# Patient Record
Sex: Female | Born: 1970 | Race: Black or African American | Hispanic: No | Marital: Single | Smoking: Current every day smoker
Health system: Southern US, Community
[De-identification: ages and names within clinical notes are randomized; demographics above are authoritative.]

---

## 2014-10-19 ENCOUNTER — Encounter (HOSPITAL_COMMUNITY): Payer: Self-pay | Admitting: Emergency Medicine

## 2014-10-19 ENCOUNTER — Emergency Department (INDEPENDENT_AMBULATORY_CARE_PROVIDER_SITE_OTHER)
Admission: EM | Admit: 2014-10-19 | Discharge: 2014-10-19 | Disposition: A | Discharge status: Home / Self Care | Payer: Self-pay | Source: Home / Self Care | Attending: Family Medicine | Admitting: Family Medicine

## 2014-10-19 DIAGNOSIS — L250 Unspecified contact dermatitis due to cosmetics: Secondary | ICD-10-CM

## 2014-10-19 MED ORDER — TRIAMCINOLONE ACETONIDE 40 MG/ML IJ SUSP
40.0000 mg | Freq: Once | INTRAMUSCULAR | Status: AC
  Administered 2014-10-19: 40 mg via INTRAMUSCULAR

## 2014-10-19 MED ORDER — PREDNISONE 20 MG PO TABS: ORAL_TABLET | ORAL | Status: AC

## 2014-10-19 MED ORDER — TRIAMCINOLONE ACETONIDE 0.1 % EX CREA: 1.0000 "application " | TOPICAL_CREAM | Freq: Two times a day (BID) | CUTANEOUS | Status: AC

## 2014-10-19 MED ORDER — TRIAMCINOLONE ACETONIDE 40 MG/ML IJ SUSP
INTRAMUSCULAR | Status: AC
  Filled 2014-10-19: qty 1

## 2014-10-19 NOTE — ED Provider Notes (Signed)
CSN: [id number]     Arrival date & time 10/19/14  1317 History   First MD Initiated Contact with Patient 10/19/14 1341     Chief Complaint  Patient presents with  . Allergic Reaction   (Consider location/radiation/quality/duration/timing/severity/associated sxs/prior Treatment) HPI Comments: 44 year old female applied a black coloring agent to her hair. Within hours she developed itching and a rash to the scalp, ears and face. Primary affect is to the posterior portion of the scalp and neck. Some puffiness to the face and around the eyes. Denies systemic symptoms. No shortness of breath, cough or peripheral swelling.  Patient is a 44 y.o. female presenting with allergic reaction.  Allergic Reaction Presenting symptoms: rash     History reviewed. No pertinent past medical history. History reviewed. No pertinent past surgical history. No family history on file. Social History  Substance Use Topics  . Smoking status: Current Every Day Smoker -- 0.50 packs/day    Types: Cigarettes  . Smokeless tobacco: None  . Alcohol Use: Yes   OB History    No data available     Review of Systems  Constitutional: Negative for fever, activity change and fatigue.  HENT: Negative.   Eyes: Negative.   Respiratory: Negative.   Cardiovascular: Negative.   Genitourinary: Negative.   Skin: Positive for rash.  Neurological: Negative.   Psychiatric/Behavioral: Negative.     Allergies  Review of patient's allergies indicates no known allergies.  Home Medications   Prior to Admission medications   Medication Sig Start Date End Date Taking? Authorizing Provider  predniSONE (DELTASONE) 20 MG tablet Take 3 tabs po on first day, 2 tabs second day, 2 tabs third day, 1 tab fourth day, 1 tab 5th day. Take with food.Start 10/20/14. 10/19/14   Hayden Rasmussen, NP  triamcinolone cream (KENALOG) 0.1 % Apply 1 application topically 2 (two) times daily. 10/19/14   Hayden Rasmussen, NP   Meds Ordered and Administered this  Visit   Medications  triamcinolone acetonide (KENALOG-40) injection 40 mg (not administered)    BP 142/94 mmHg  Pulse 94  Temp(Src) 98.4 F (36.9 C) (Oral)  Resp 16  SpO2 99%  LMP 10/19/2014 No data found.   Physical Exam  Constitutional: She is oriented to person, place, and time. She appears well-developed and well-nourished. No distress.  HENT:  No intraoral swelling, edema, erythema, hoarseness.  Eyes: EOM are normal.  Neck: Normal range of motion. Neck supple.  Cardiovascular: Normal rate.   Pulmonary/Chest: Effort normal and breath sounds normal. No respiratory distress.  Musculoskeletal: She exhibits no edema.  Neurological: She is alert and oriented to person, place, and time. She exhibits normal muscle tone.  Skin: Skin is warm and dry. Rash noted.  Pruritic papular in scaling rash to the posterior scalp and neck. There is erythema, swelling and moist rash to the outer ears. Mild puffiness around both eyes. The eye proper is unaffected. Patient is unaffected.  Psychiatric: She has a normal mood and affect.  Nursing note and vitals reviewed.   ED Course  Procedures (including critical care time)  Labs Review Labs Reviewed - No data to display  Imaging Review No results found.   Visual Acuity Review  Right Eye Distance:   Left Eye Distance:   Bilateral Distance:    Right Eye Near:   Left Eye Near:    Bilateral Near:         MDM   1. Contact dermatitis due to cosmetics    Kenalog 40 mg IM  Prednisone taper dose Triamcinolone topicl cream. Advised to use as lotion to scalp with addition of small amt of water. Cool compreses    Hayden Rasmussen, NP 10/19/14 1355

## 2014-10-19 NOTE — Discharge Instructions (Signed)

## 2014-10-19 NOTE — ED Notes (Signed)
C/o allergic reaction to hair dye onset 2 days ago Sx include: rash on scalp, facial swelling and swelling of bilateral ears Denies fevers, chills, SOB, dyspnea Alert and oriented x4... No acute distress.

## 2014-10-20 ENCOUNTER — Emergency Department (HOSPITAL_COMMUNITY): Payer: Self-pay

## 2014-10-20 ENCOUNTER — Emergency Department (HOSPITAL_COMMUNITY)
Admission: EM | Admit: 2014-10-20 | Discharge: 2014-10-20 | Disposition: A | Discharge status: Home / Self Care | Payer: Self-pay | Attending: Emergency Medicine | Admitting: Emergency Medicine

## 2014-10-20 ENCOUNTER — Encounter (HOSPITAL_COMMUNITY): Payer: Self-pay

## 2014-10-20 DIAGNOSIS — L234 Allergic contact dermatitis due to dyes: Secondary | ICD-10-CM | POA: Insufficient documentation

## 2014-10-20 DIAGNOSIS — Z7952 Long term (current) use of systemic steroids: Secondary | ICD-10-CM | POA: Insufficient documentation

## 2014-10-20 DIAGNOSIS — Z720 Tobacco use: Secondary | ICD-10-CM | POA: Insufficient documentation

## 2014-10-20 DIAGNOSIS — H0520 Unspecified exophthalmos: Secondary | ICD-10-CM | POA: Insufficient documentation

## 2014-10-20 DIAGNOSIS — T7840XD Allergy, unspecified, subsequent encounter: Secondary | ICD-10-CM

## 2014-10-20 LAB — CBC WITH DIFFERENTIAL/PLATELET
BASOS ABS: 0 10*3/uL (ref 0.0–0.1)
Basophils Relative: 0 %
EOS ABS: 0.4 10*3/uL (ref 0.0–0.7)
Eosinophils Relative: 5 %
HCT: 34.1 % — ABNORMAL LOW (ref 36.0–46.0)
HEMOGLOBIN: 10.4 g/dL — AB (ref 12.0–15.0)
LYMPHS PCT: 13 %
Lymphs Abs: 1 10*3/uL (ref 0.7–4.0)
MCH: 21.8 pg — ABNORMAL LOW (ref 26.0–34.0)
MCHC: 30.5 g/dL (ref 30.0–36.0)
MCV: 71.3 fL — ABNORMAL LOW (ref 78.0–100.0)
Monocytes Absolute: 0.3 10*3/uL (ref 0.1–1.0)
Monocytes Relative: 4 %
NEUTROS ABS: 5.8 10*3/uL (ref 1.7–7.7)
NEUTROS PCT: 78 %
Platelets: 261 10*3/uL (ref 150–400)
RBC: 4.78 MIL/uL (ref 3.87–5.11)
RDW: 17.6 % — ABNORMAL HIGH (ref 11.5–15.5)
WBC: 7.5 10*3/uL (ref 4.0–10.5)

## 2014-10-20 LAB — COMPREHENSIVE METABOLIC PANEL
ALBUMIN: 4.2 g/dL (ref 3.5–5.0)
ALK PHOS: 68 U/L (ref 38–126)
ALT: 16 U/L (ref 14–54)
ANION GAP: 10 (ref 5–15)
AST: 28 U/L (ref 15–41)
BUN: 6 mg/dL (ref 6–20)
CALCIUM: 8.6 mg/dL — AB (ref 8.9–10.3)
CHLORIDE: 106 mmol/L (ref 101–111)
CO2: 26 mmol/L (ref 22–32)
Creatinine, Ser: 0.66 mg/dL (ref 0.44–1.00)
GFR calc Af Amer: >60 mL/min (ref >60)
GFR calc non Af Amer: >60 mL/min (ref >60)
GLUCOSE: 113 mg/dL — AB (ref 65–99)
Potassium: 3.6 mmol/L (ref 3.5–5.1)
SODIUM: 142 mmol/L (ref 135–145)
Total Bilirubin: 0.5 mg/dL (ref 0.3–1.2)
Total Protein: 7.6 g/dL (ref 6.5–8.1)

## 2014-10-20 MED ORDER — DIPHENHYDRAMINE HCL 25 MG PO TABS
50.0000 mg | ORAL_TABLET | ORAL | Status: AC | PRN
Start: 2014-10-20 — End: ?

## 2014-10-20 MED ORDER — IOHEXOL 300 MG/ML  SOLN
75.0000 mL | Freq: Once | INTRAMUSCULAR | Status: AC | PRN
  Administered 2014-10-20: 75 mL via INTRAVENOUS

## 2014-10-20 NOTE — ED Provider Notes (Signed)
CSN: [id number]     Arrival date & time 10/20/14  0127 History  By signing my name below, I, Elon Spanner, attest that this documentation has been prepared under the direction and in the presence of Loren Racer, MD. Electronically Signed: Elon Spanner, ED Scribe. 10/20/2014. 2:44 AM.    Chief Complaint  Patient presents with  . Alcohol Intoxication  . Blurred Vision   The history is provided by the patient. No language interpreter was used.   HPI Comments: Denise Burke is a 44 y.o. female who presents to the Emergency Department complaining of worsening left eye blurred vision onset today after coloring her hair.  Associated symptoms include tearing and redness of the left eye as well as facial swelling. The patient was seen today at Urgent Care after coloring her hair and developing a rash to the scalp ears and face as well as puffiness to the face and around the eyes.  She was dx'd with contact dermatitis and given Kenalog IM, a prednisone taper, and triamcinolone cream.  Patient denies hx of thyroid conditions.    Patient reports drinking alcohol at home prior to arrival.   History reviewed. No pertinent past medical history. History reviewed. No pertinent past surgical history. History reviewed. No pertinent family history. Social History  Substance Use Topics  . Smoking status: Current Every Day Smoker -- 0.50 packs/day    Types: Cigarettes  . Smokeless tobacco: None  . Alcohol Use: Yes   OB History    No data available     Review of Systems  Constitutional: Positive for fever. Negative for chills.  HENT: Positive for facial swelling. Negative for trouble swallowing and voice change.   Eyes: Positive for pain, discharge, redness and visual disturbance.  Respiratory: Negative for shortness of breath.   Cardiovascular: Negative for chest pain and palpitations.  Gastrointestinal: Negative for nausea, vomiting and abdominal pain.  Musculoskeletal: Negative for back pain,  neck pain and neck stiffness.  Skin: Positive for rash.  Neurological: Negative for dizziness, weakness, light-headedness, numbness and headaches.  All other systems reviewed and are negative.     Allergies  Review of patient's allergies indicates no known allergies.  Home Medications   Prior to Admission medications   Medication Sig Start Date End Date Taking? Authorizing Provider  diphenhydrAMINE (BENADRYL) 25 MG tablet Take 2 tablets (50 mg total) by mouth every 4 (four) hours as needed for allergies. 10/20/14   Loren Racer, MD  predniSONE (DELTASONE) 20 MG tablet Take 3 tabs po on first day, 2 tabs second day, 2 tabs third day, 1 tab fourth day, 1 tab 5th day. Take with food.Start 10/20/14. 10/19/14   Hayden Rasmussen, NP  triamcinolone cream (KENALOG) 0.1 % Apply 1 application topically 2 (two) times daily. 10/19/14   Hayden Rasmussen, NP   BP 125/89 mmHg  Pulse 107  Temp(Src) 98.3 F (36.8 C) (Oral)  Resp 19  Ht 5' 1.5" (1.562 m)  Wt 130 lb (58.968 kg)  BMI 24.17 kg/m2  SpO2 99%  LMP 10/19/2014 Physical Exam  Constitutional: She is oriented to person, place, and time. She appears well-developed and well-nourished. No distress.  HENT:  Head: Normocephalic and atraumatic.  Mouth/Throat: Oropharynx is clear and moist.  Mild bilateral periorbital swelling. Patient also with bilateral exophthalmosis. Mild conjunctival injection.  Eyes: EOM are normal. Pupils are equal, round, and reactive to light.  Pupils equal round reactive to light.  Neck: Normal range of motion. Neck supple.  Cardiovascular: Normal rate and regular  rhythm.  Exam reveals no gallop and no friction rub.   No murmur heard. Pulmonary/Chest: Effort normal and breath sounds normal. No stridor. No respiratory distress. She has no wheezes. She has no rales. She exhibits no tenderness.  Abdominal: Soft. Bowel sounds are normal. She exhibits no distension and no mass. There is no tenderness. There is no rebound and no  guarding.  Musculoskeletal: Normal range of motion. She exhibits no edema or tenderness.  Neurological: She is alert and oriented to person, place, and time.  Moves all extremities without deficit. Sensation grossly intact. Ambulating without difficulty. Patient appears to be intoxicated and admits to drinking alcohol.  Skin: Skin is warm and dry. No rash noted. No erythema.  Psychiatric: She has a normal mood and affect. Her behavior is normal.  Nursing note and vitals reviewed.   ED Course  Procedures (including critical care time)  DIAGNOSTIC STUDIES: Oxygen Saturation is 98% on RA, normal by my interpretation.    COORDINATION OF CARE:  2:40 AM Discussed treatment plan with patient at bedside.  Patient acknowledges and agrees with plan.    Labs Review Labs Reviewed  CBC WITH DIFFERENTIAL/PLATELET - Abnormal; Notable for the following:    Hemoglobin 10.4 (*)    HCT 34.1 (*)    MCV 71.3 (*)    MCH 21.8 (*)    RDW 17.6 (*)    All other components within normal limits  COMPREHENSIVE METABOLIC PANEL - Abnormal; Notable for the following:    Glucose, Bld 113 (*)    Calcium 8.6 (*)    All other components within normal limits    Imaging Review Ct Orbits W/cm  10/20/2014   CLINICAL DATA:  Protrusion of eye balls. Puffiness, blurred vision, tearing and redness after hair coloring  EXAM: CT ORBITS WITH CONTRAST  TECHNIQUE: Multidetector CT imaging of the orbits was performed following the bolus administration of intravenous contrast.  CONTRAST:  75mL OMNIPAQUE IOHEXOL 300 MG/ML  SOLN  COMPARISON:  None.  FINDINGS: There is symmetric preseptal prominent thickness and enhancement conjunctivitis with chemosis. No postseptal inflammatory change noted. Both globes are proptotic, likely developmental. Prominent but symmetric and within normal limits lacrimal glands, which are herniated due to the proptosis. No extraocular muscle enlargement. Unremarkable globes. No sinusitis. Major vessels are  patent. Partial intracranial imaging is negative.  IMPRESSION: Superficial inflammation suggesting bilateral conjunctivitis. No postseptal inflammation.   Electronically Signed   By: Marnee Spring M.D.   On: 10/20/2014 04:08   I have personally reviewed and evaluated these images and lab results as part of my medical decision-making.   EKG Interpretation None      MDM   Final diagnoses:  Protrusion of eyeballs  Allergic reaction, subsequent encounter    I personally performed the services described in this documentation, which was scribed in my presence. The recorded information has been reviewed and is accurate.  Patient with 20/20 vision in the emergency department. CT scan of the orbits revealed no pathology. Patient is asking to be discharged so that she can take a taxi back home. She is coherent and understands the need to follow-up with ophthalmology as an outpatient. She's been given return precautions and has voice understanding. She will continue to take her prednisone and Benadryl as needed for swelling.   Loren Racer, MD 10/20/14 [phone]

## 2014-10-20 NOTE — ED Notes (Signed)
Called Dr. Ranae Palms as patient is ready to leave now. Md acknowledges, reviews visual acuity. Allows patient to leave if someone will come pick her up. Discussed with patient, attempting to call relative after explaining to patient. Iv access removed.

## 2014-10-20 NOTE — ED Notes (Signed)
Dr. Ranae Palms at the bedside talking to the pt.

## 2014-10-20 NOTE — ED Notes (Signed)
Pt very upset trying to leave the hospital, pt encouraged to try to relax and wait for the MD to dc her  Home for her own safe, pt still yelling on the hallway, security called to prevent elopement.

## 2014-10-20 NOTE — Discharge Instructions (Signed)
Exophthalmos  Exophthalmos (also called proptosis) is a condition where the eyes move forward. They look as if they are "popping out." This can happen in one or both eyes. When the eyes are pushed forward, damage can be done to:  The main nerve between the eye and the brain that contains the nerves for vision (optic nerve).  The muscles that make the eye move.  The inside of the eye from increased pressure (glaucoma).  The front surface of the eye (cornea) because of exposure and dryness. CAUSES   Thyroid disease.  Overactive thyroid gland.  Certain diseases where the body reacts to its own tissues (autoimmune diseases).  Graves disease (a form of overactive thyroid disease).  Wegener's disease.  Others.  Anything pushing the eyes forward from behind.  Tumor(s).  Eye cancer.  Bleeding behind the eye from a tumor or blood vessel problems.  Trauma (with bleeding behind the eye).  Problems with the arteries and veins behind the eye (aneurysm, cavernous sinus thrombosis, etc).  Cysts or a pus filled area (abscess) behind the eye.  Tumors that have spread to the eye socket from cancer in other areas of the body (metastatic cancer).  Cancer of the blood system (lymphoma and others).  Infection behind the eye.  An abnormal skull structure.  Some genetic diseases and abnormalities.  Pseudoproptosis (or false proptosis). This is a condition where the eye looks like it is pushed forward but is really not. The eye is just bigger (longer) than normal, or the opposite eye is smaller than normal, which makes one eye look bigger.  Prominent eyes in otherwise normal people. SYMPTOMS   Bulging eye(s).  Dry and irritated eyes.  Eyes not closing all the way when asleep.  Double vision - seeing two of everything (diplopia).  Trouble looking up with one or both eyes.  Symptoms of the disease causing exophthalmos. For instance, with an overactive thyroid gland, you may feel  hot all of the time, sweat a lot, have weight loss and a lot of energy. DIAGNOSIS  An eye professional can tell you if you have this condition during an eye exam. He or she can measure how far the eye(s) are forward compared to normal. X-rays, CT scan, ultrasound and other tests may be done to look at the area behind the eyes. TREATMENT   Treatment is aimed at the condition causing exophthalmos.  If mild double vision is present, it may be possible to relieve the symptoms with special glasses containing prisms.  If severe double vision is present, or if there is danger to the eyes, surgery may be needed. Surgery can relieve the pressure on the eyes. It can also free up the eye muscles so the eyes can move properly. SEEK MEDICAL CARE IF:   It looks like one or both eyes bulging forward.  You have double vision.  You have trouble looking up.  You generally do not feel well. Document Released: 12/07/2003 Document Revised: 03/30/2011 Document Reviewed: 05/04/2007 [facility] Patient Information 2015 [place], [place]. This information is not intended to replace advice given to you by your health care provider. Make sure you discuss any questions you have with your health care provider.  Allergies Allergies may happen from anything your body is sensitive to. This may be food, medicines, pollens, chemicals, and nearly anything around you in everyday life that produces allergens. An allergen is anything that causes an allergy producing substance. Heredity is often a factor in causing these problems. This means you  may have some of the same allergies as your parents. Food allergies happen in all age groups. Food allergies are some of the most severe and life threatening. Some common food allergies are cow's milk, seafood, eggs, nuts, wheat, and soybeans. SYMPTOMS   Swelling around the mouth.  An itchy red rash or hives.  Vomiting or diarrhea.  Difficulty breathing. SEVERE ALLERGIC REACTIONS ARE  LIFE-THREATENING. This reaction is called anaphylaxis. It can cause the mouth and throat to swell and cause difficulty with breathing and swallowing. In severe reactions only a trace amount of food (for example, peanut oil in a salad) may cause death within seconds. Seasonal allergies occur in all age groups. These are seasonal because they usually occur during the same season every year. They may be a reaction to molds, grass pollens, or tree pollens. Other causes of problems are house dust mite allergens, pet dander, and mold spores. The symptoms often consist of nasal congestion, a runny itchy nose associated with sneezing, and tearing itchy eyes. There is often an associated itching of the mouth and ears. The problems happen when you come in contact with pollens and other allergens. Allergens are the particles in the air that the body reacts to with an allergic reaction. This causes you to release allergic antibodies. Through a chain of events, these eventually cause you to release histamine into the blood stream. Although it is meant to be protective to the body, it is this release that causes your discomfort. This is why you were given anti-histamines to feel better. If you are unable to pinpoint the offending allergen, it may be determined by skin or blood testing. Allergies cannot be cured but can be controlled with medicine. Hay fever is a collection of all or some of the seasonal allergy problems. It may often be treated with simple over-the-counter medicine such as diphenhydramine. Take medicine as directed. Do not drink alcohol or drive while taking this medicine. Check with your caregiver or package insert for child dosages. If these medicines are not effective, there are many new medicines your caregiver can prescribe. Stronger medicine such as nasal spray, eye drops, and corticosteroids may be used if the first things you try do not work well. Other treatments such as immunotherapy or  desensitizing injections can be used if all else fails. Follow up with your caregiver if problems continue. These seasonal allergies are usually not life threatening. They are generally more of a nuisance that can often be handled using medicine. HOME CARE INSTRUCTIONS   If unsure what causes a reaction, keep a diary of foods eaten and symptoms that follow. Avoid foods that cause reactions.  If hives or rash are present:  Take medicine as directed.  You may use an over-the-counter antihistamine (diphenhydramine) for hives and itching as needed.  Apply cold compresses (cloths) to the skin or take baths in cool water. Avoid hot baths or showers. Heat will make a rash and itching worse.  If you are severely allergic:  Following a treatment for a severe reaction, hospitalization is often required for closer follow-up.  Wear a medic-alert bracelet or necklace stating the allergy.  You and your family must learn how to give adrenaline or use an anaphylaxis kit.  If you have had a severe reaction, always carry your anaphylaxis kit or EpiPen with you. Use this medicine as directed by your caregiver if a severe reaction is occurring. Failure to do so could have a fatal outcome. SEEK MEDICAL CARE IF:  You suspect  a food allergy. Symptoms generally happen within 30 minutes of eating a food.  Your symptoms have not gone away within 2 days or are getting worse.  You develop new symptoms.  You want to retest yourself or your child with a food or drink you think causes an allergic reaction. Never do this if an anaphylactic reaction to that food or drink has happened before. Only do this under the care of a caregiver. SEEK IMMEDIATE MEDICAL CARE IF:   You have difficulty breathing, are wheezing, or have a tight feeling in your chest or throat.  You have a swollen mouth, or you have hives, swelling, or itching all over your body.  You have had a severe reaction that has responded to your  anaphylaxis kit or an EpiPen. These reactions may return when the medicine has worn off. These reactions should be considered life threatening. MAKE SURE YOU:   Understand these instructions.  Will watch your condition.  Will get help right away if you are not doing well or get worse. Document Released: 03/31/2002 Document Revised: 05/02/2012 Document Reviewed: 09/05/2007 [facility] Patient Information 2015 [place], [place]. This information is not intended to replace advice given to you by your health care provider. Make sure you discuss any questions you have with your health care provider.

## 2014-10-20 NOTE — ED Notes (Signed)
Pt here for blurred vision, was seen at [facility] today for allergic reaction to hair dye and started prednisone and benardryl, now has swelling to left eye and blurred vision. Pt does admit to etoh use tonight.

## 2016-10-23 IMAGING — CT CT ORBITS W/ CM
3 of 4 series · 10 of 47 positions shown, 11 images · IV contrast (omnipaque)
Comparison: None.

CLINICAL DATA: Protrusion of eye balls. Puffiness, blurred vision,
tearing and redness after hair coloring

EXAM:
CT ORBITS WITH CONTRAST
TECHNIQUE: Multidetector CT imaging of the orbits was performed following the
bolus administration of intravenous contrast.
CONTRAST:  75mL OMNIPAQUE IOHEXOL 300 MG/ML  SOLN

[Series 9: facialbone 2.0 st · axial · 0.32mm/px · z∈[-83,-35]mm · 4 of 35 slices shown, 5 images]
[im 7/35  brain]
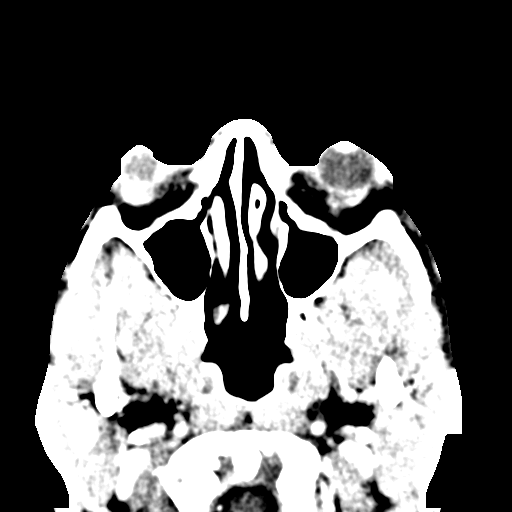
[im 7/35  bone]
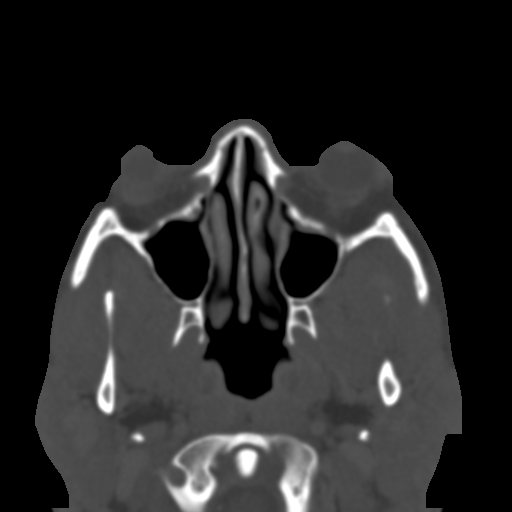
[im 15/35  bone]
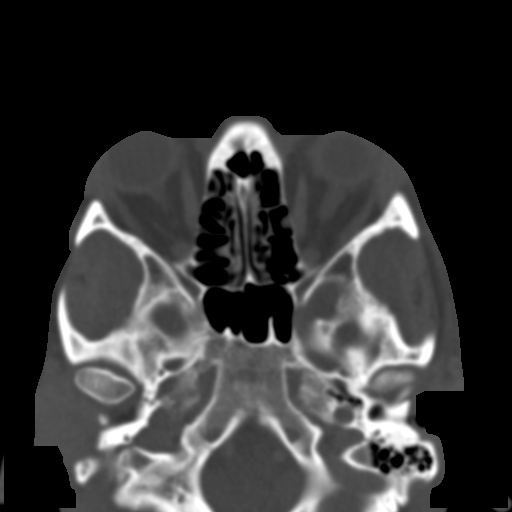
[im 23/35  bone]
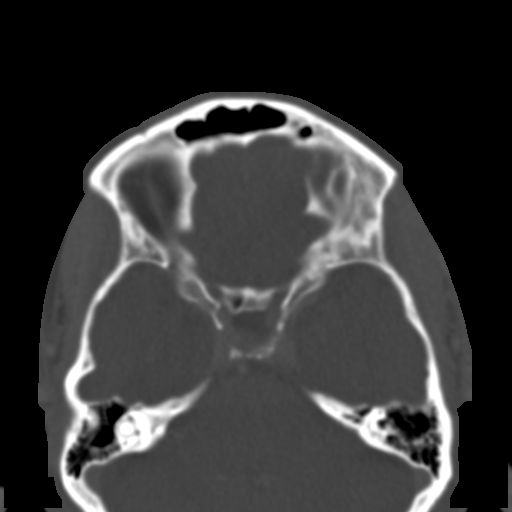
[im 31/35  bone]
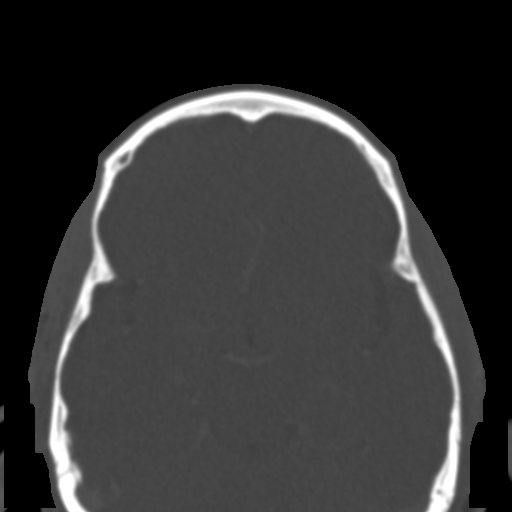

[Series 13: facialbone 2.0 cor st · coronal · 0.14mm/px · 3 of 66 slices shown]
[im 22/66  bone]
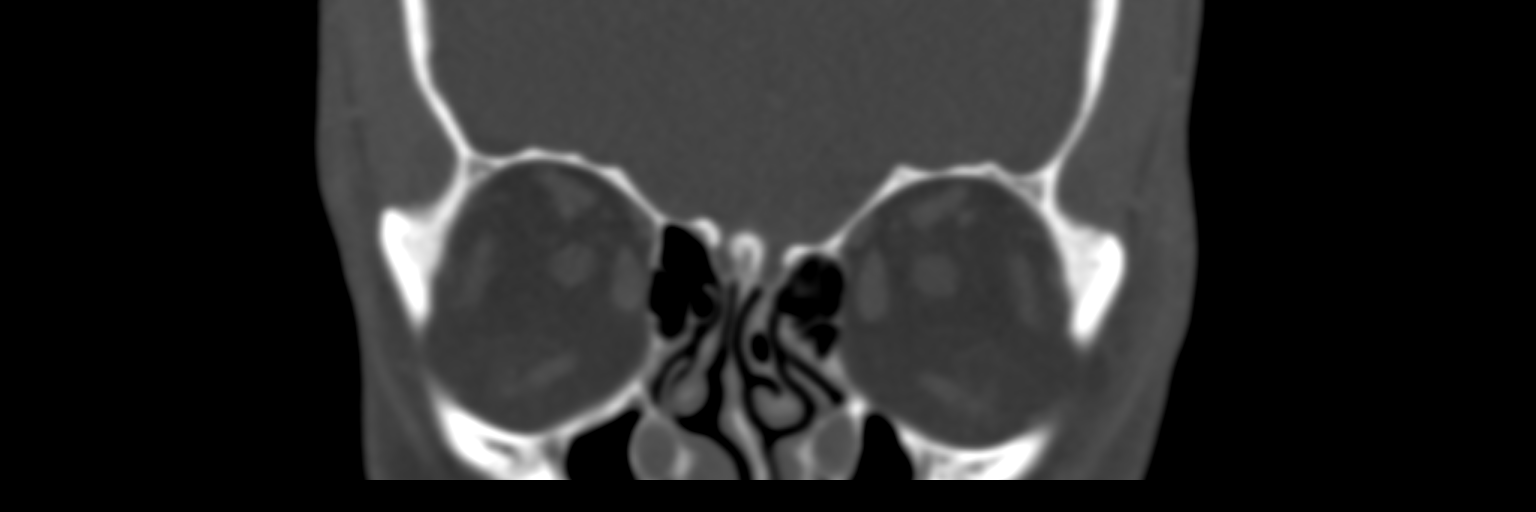
[im 29/66  bone]
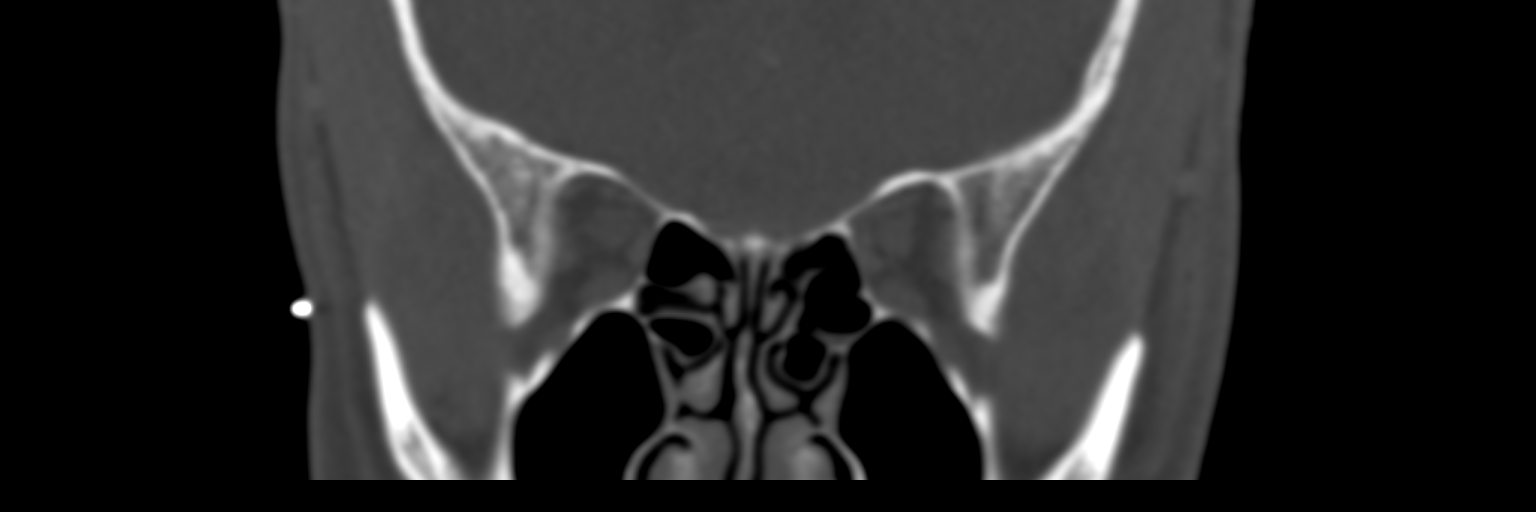
[im 37/66  bone]
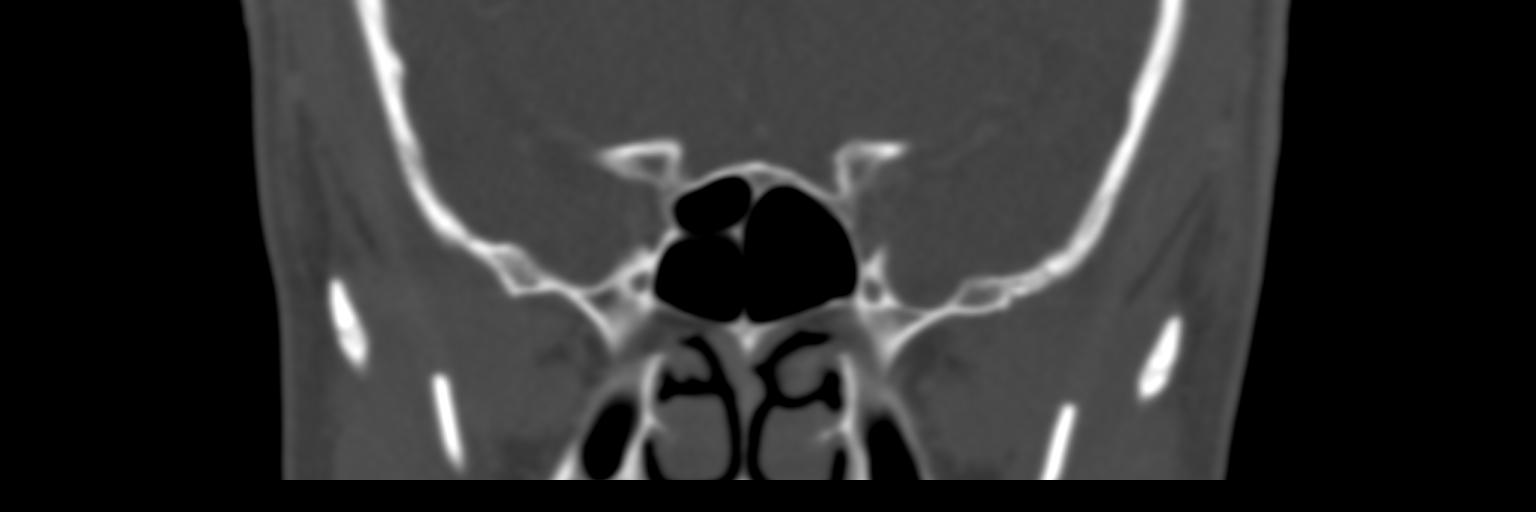

[Series 14: facialbone 2.0 sag st · sagittal · 0.14mm/px · 3 of 91 slices shown]
[im 31/91  bone]
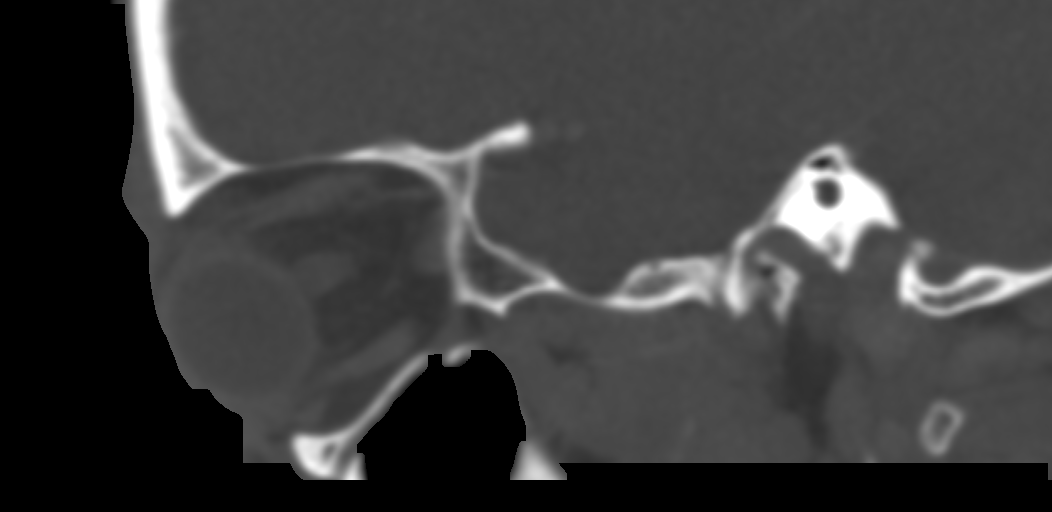
[im 46/91  bone]
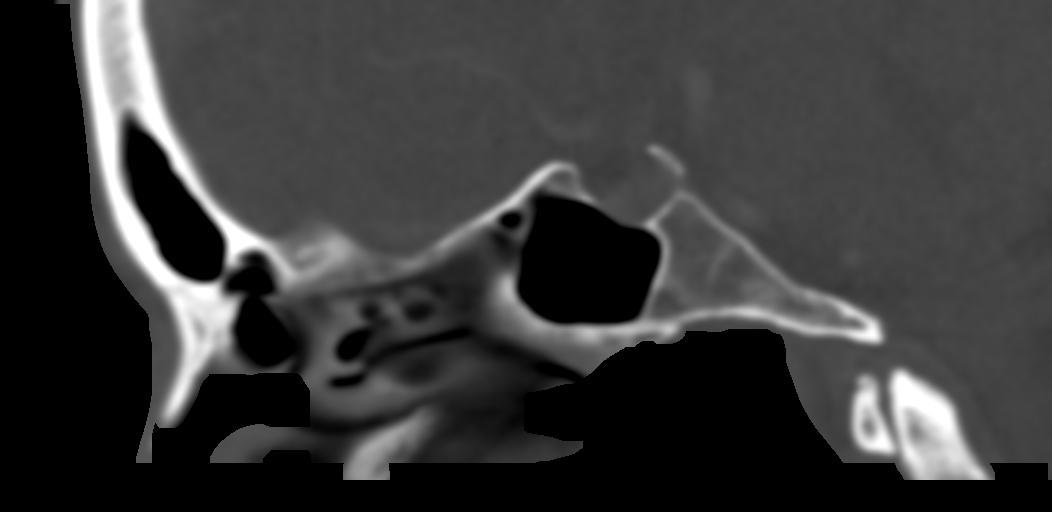
[im 61/91  bone]
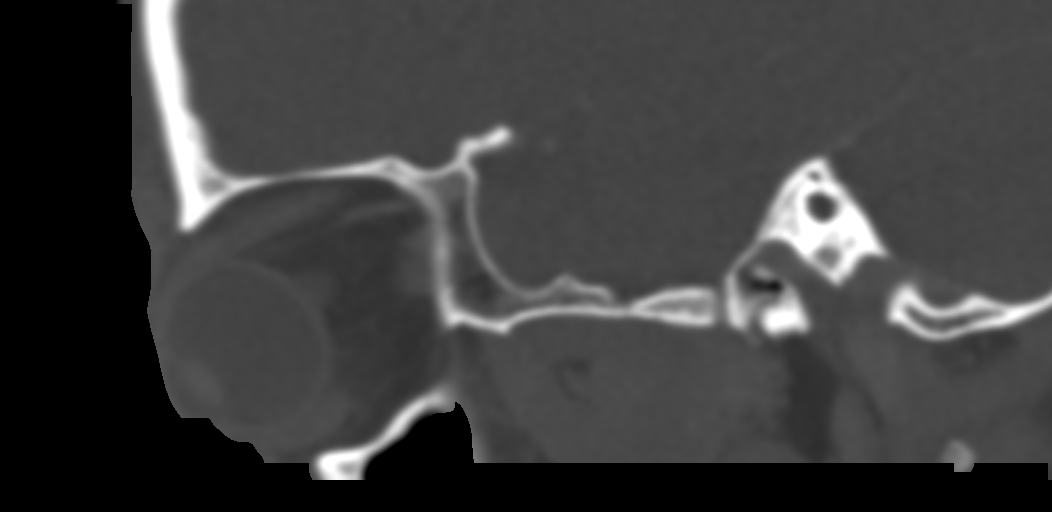

[10 of 47 positions shown; findings below may reference images not displayed]

FINDINGS: There is symmetric preseptal prominent thickness and enhancement
conjunctivitis with chemosis. No postseptal inflammatory change
noted. Both globes are proptotic, likely developmental. Prominent
but symmetric and within normal limits lacrimal glands, which are
herniated due to the proptosis. No extraocular muscle enlargement.
Unremarkable globes. No sinusitis. Major vessels are patent. Partial
intracranial imaging is negative.
IMPRESSION: Superficial inflammation suggesting bilateral conjunctivitis. No
postseptal inflammation.

## 2022-04-07 ENCOUNTER — Ambulatory Visit: Payer: Self-pay | Admitting: *Deleted

## 2022-04-07 NOTE — Telephone Encounter (Signed)
3rd attempt to contact patient at [phone], to review sx dental pain. No answer, [facility]MTCB #[phone].

## 2022-04-07 NOTE — Telephone Encounter (Signed)
2nd attempt, Patient called, left VM to return the call to the NT to discuss symptoms.

## 2022-04-07 NOTE — Telephone Encounter (Signed)
Summary: Dental   Patient is experiencing dental pain (2 months) and wants it to be pulled,  due to insurance patient states she does not have a dental appt until April 2.          Called patient [phone] to review dental pain. No answer LVMTCB #[phone].
